# Patient Record
Sex: Female | Born: 1960 | Race: White | Hispanic: No | Smoking: Current some day smoker
Health system: Southern US, Community
[De-identification: ages and names within clinical notes are randomized; demographics above are authoritative.]

---

## 1999-03-24 ENCOUNTER — Other Ambulatory Visit: Admission: RE | Admit: 1999-03-24 | Discharge: 1999-03-24 | Payer: Self-pay | Admitting: Obstetrics & Gynecology

## 2000-04-21 ENCOUNTER — Ambulatory Visit (HOSPITAL_BASED_OUTPATIENT_CLINIC_OR_DEPARTMENT_OTHER): Admission: RE | Admit: 2000-04-21 | Discharge: 2000-04-21 | Payer: Self-pay | Admitting: General Surgery

## 2001-06-27 ENCOUNTER — Other Ambulatory Visit: Admission: RE | Admit: 2001-06-27 | Discharge: 2001-06-27 | Payer: Self-pay | Admitting: Obstetrics & Gynecology

## 2002-07-05 ENCOUNTER — Other Ambulatory Visit: Admission: RE | Admit: 2002-07-05 | Discharge: 2002-07-05 | Payer: Self-pay | Admitting: Obstetrics & Gynecology

## 2003-07-30 ENCOUNTER — Other Ambulatory Visit: Admission: RE | Admit: 2003-07-30 | Discharge: 2003-07-30 | Payer: Self-pay | Admitting: Obstetrics & Gynecology

## 2004-08-02 ENCOUNTER — Other Ambulatory Visit: Admission: RE | Admit: 2004-08-02 | Discharge: 2004-08-02 | Payer: Self-pay | Admitting: Obstetrics & Gynecology

## 2005-08-03 ENCOUNTER — Other Ambulatory Visit: Admission: RE | Admit: 2005-08-03 | Discharge: 2005-08-03 | Payer: Self-pay | Admitting: Obstetrics & Gynecology

## 2014-01-23 ENCOUNTER — Encounter: Payer: Self-pay | Admitting: Family Medicine

## 2014-01-23 ENCOUNTER — Encounter (INDEPENDENT_AMBULATORY_CARE_PROVIDER_SITE_OTHER): Payer: Self-pay

## 2014-01-23 ENCOUNTER — Ambulatory Visit (INDEPENDENT_AMBULATORY_CARE_PROVIDER_SITE_OTHER): Payer: BC Managed Care – PPO | Admitting: Family Medicine

## 2014-01-23 VITALS — BP 132/81 | HR 95 | Temp 98.9°F | Ht 67.0 in | Wt 126.2 lb

## 2014-01-23 DIAGNOSIS — J329 Chronic sinusitis, unspecified: Secondary | ICD-10-CM

## 2014-01-23 MED ORDER — METHYLPREDNISOLONE ACETATE 80 MG/ML IJ SUSP
80.0000 mg | Freq: Once | INTRAMUSCULAR | Status: AC
  Administered 2014-01-23: 80 mg via INTRAMUSCULAR

## 2014-01-23 MED ORDER — AMOXICILLIN 875 MG PO TABS: 875.0000 mg | ORAL_TABLET | Freq: Two times a day (BID) | ORAL | Status: AC

## 2014-01-23 NOTE — Progress Notes (Signed)
   Subjective:    Patient ID: Kaylee Wilkerson, female    DOB: [birth year], 53 y.o.   MRN: [id number]  HPI This 53 y.o. female presents for evaluation of sinus pressure and discomfort for the last few days. She has hx of sinus infections.   Review of Systems No chest pain, SOB, HA, dizziness, vision change, N/V, diarrhea, constipation, dysuria, urinary urgency or frequency, myalgias, arthralgias or rash.     Objective:   Physical Exam Vital signs noted  Well developed well nourished female.  HEENT - Head atraumatic Normocephalic                Eyes - PERRLA, Conjuctiva - clear Sclera- Clear EOMI                Ears - EAC's Wnl TM's Wnl Gross Hearing WNL                Throat - oropharanx wnl Respiratory - Lungs CTA bilateral Cardiac - RRR S1 and S2 without murmur GI - Abdomen soft Nontender and bowel sounds active x 4 Extremities - No edema. Neuro - Grossly intact.       Assessment & Plan:  Sinusitis - Plan: methylPREDNISolone acetate (DEPO-MEDROL) injection 80 mg, amoxicillin (AMOXIL) 875 MG tablet Push po fluids, rest, tylenol and motrin otc prn as directed for fever, arthralgias, and myalgias.  Follow up prn if sx's continue or persist.  Deatra CanterWilliam J Oxford FNP

## 2014-01-23 NOTE — Addendum Note (Signed)
Addended by: Deatra CanterXFORD, Jacynda Brunke J on: 01/23/2014 03:35 PM   Modules accepted: Level of Service

## 2015-03-09 ENCOUNTER — Other Ambulatory Visit: Payer: Self-pay | Admitting: Obstetrics & Gynecology

## 2015-03-09 DIAGNOSIS — R928 Other abnormal and inconclusive findings on diagnostic imaging of breast: Secondary | ICD-10-CM

## 2015-03-17 ENCOUNTER — Ambulatory Visit
Admission: RE | Admit: 2015-03-17 | Discharge: 2015-03-17 | Disposition: A | Discharge status: Home / Self Care | Payer: BLUE CROSS/BLUE SHIELD | Source: Ambulatory Visit | Attending: Obstetrics & Gynecology | Admitting: Obstetrics & Gynecology

## 2015-03-17 DIAGNOSIS — R928 Other abnormal and inconclusive findings on diagnostic imaging of breast: Secondary | ICD-10-CM

## 2016-04-25 IMAGING — MG MM DIAG BREAST TOMO UNI LEFT
4 series · 4 of 12 positions shown · non-contrast
Comparison: Previous exams.

CLINICAL DATA: Screening recall for left breast asymmetries.

EXAM:
DIGITAL DIAGNOSTIC LEFT MAMMOGRAM WITH 3D TOMOSYNTHESIS AND CAD
LEFT BREAST ULTRASOUND

[L CC]
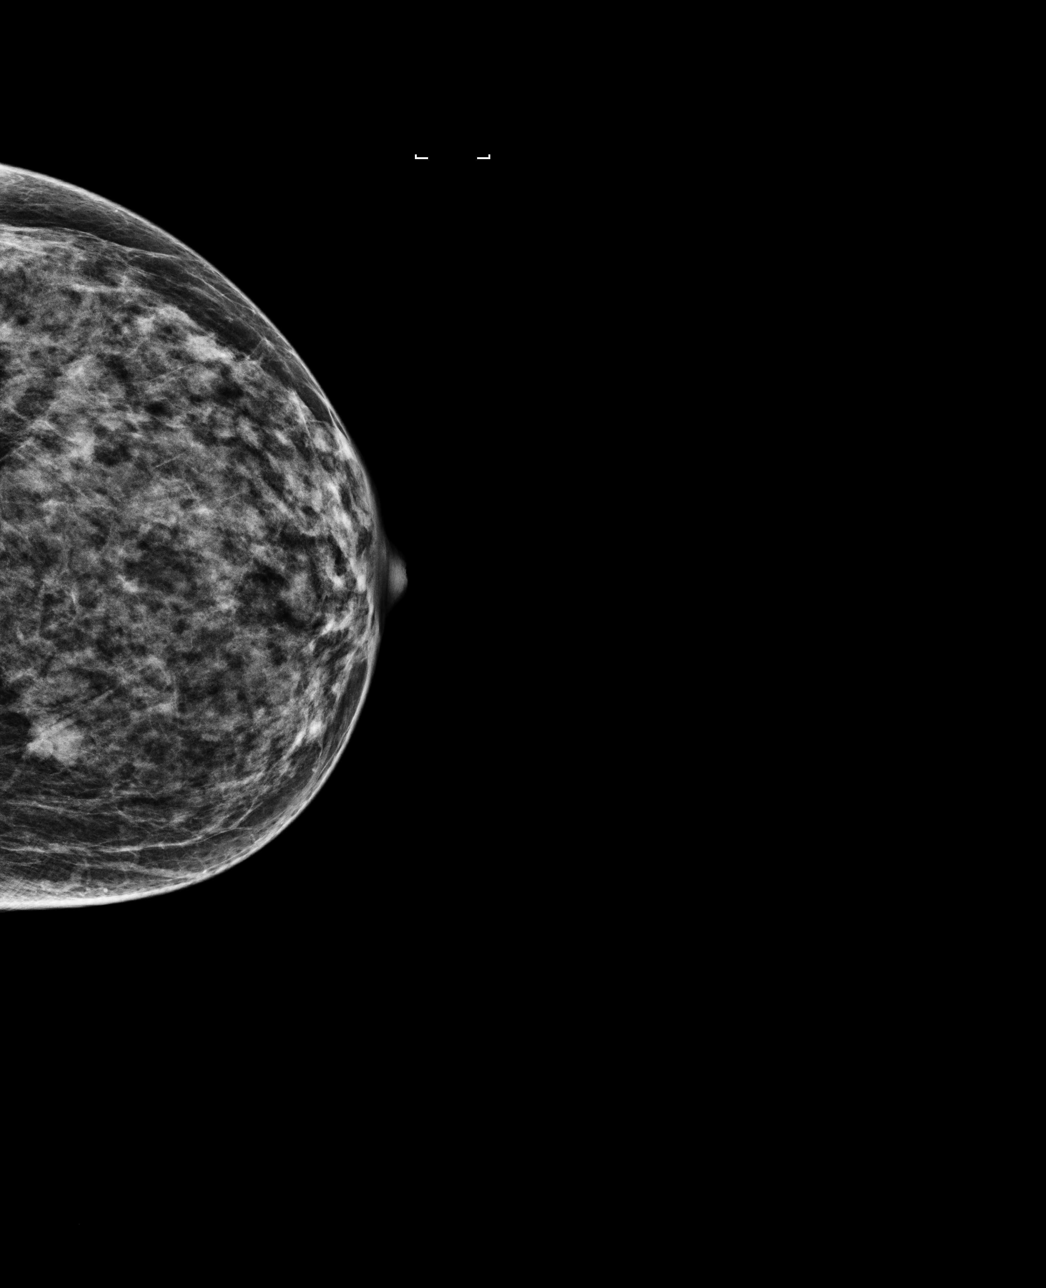

[L MLO]
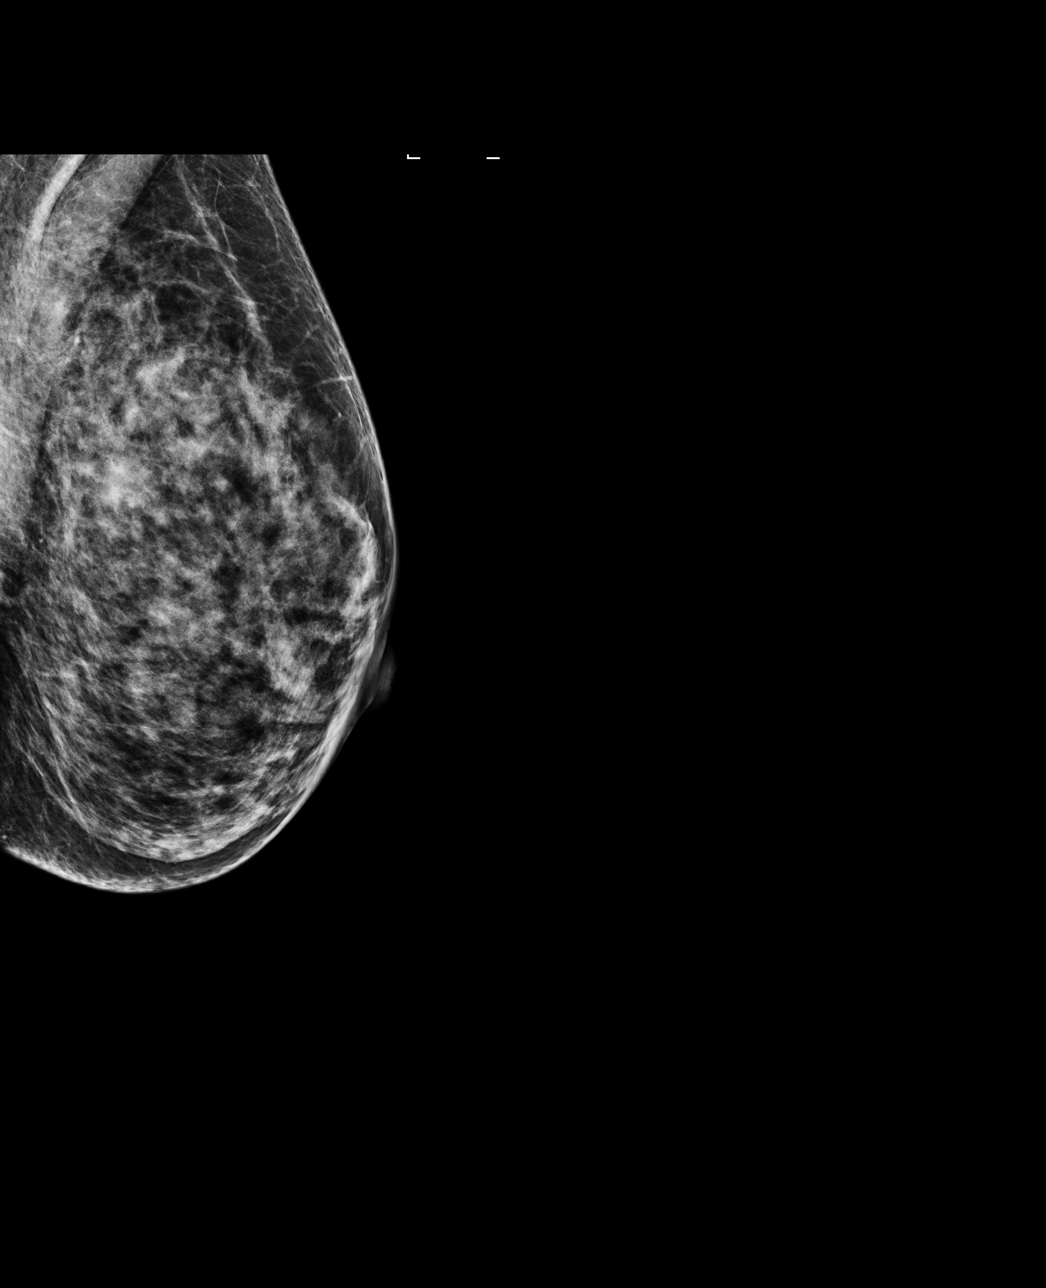

[L MLO tomo · tomo slice 27/53.0]
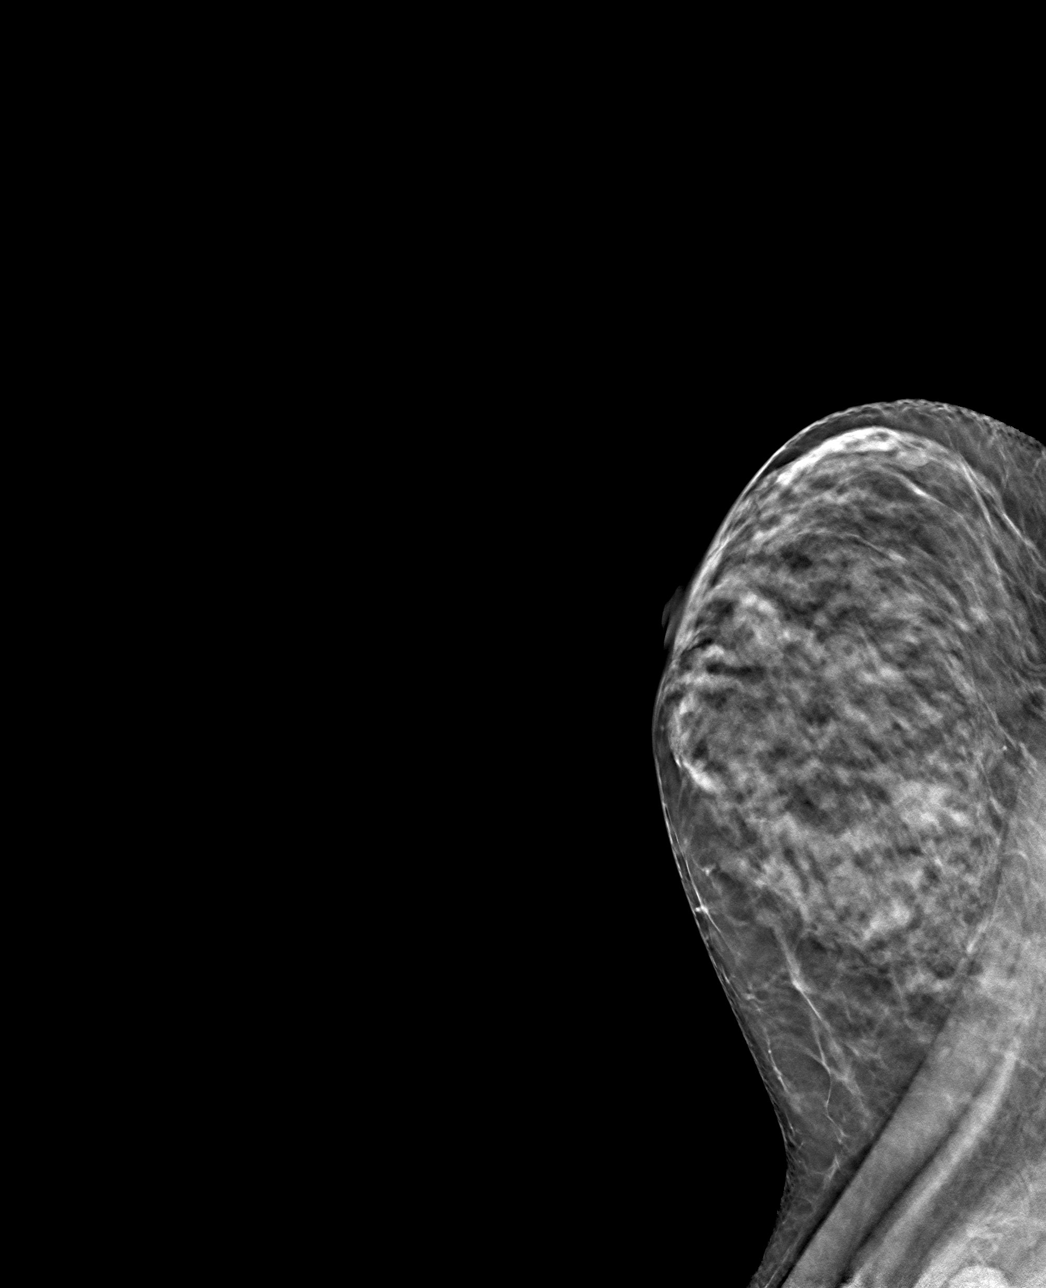

[L CC tomo · tomo slice 23/45.0]
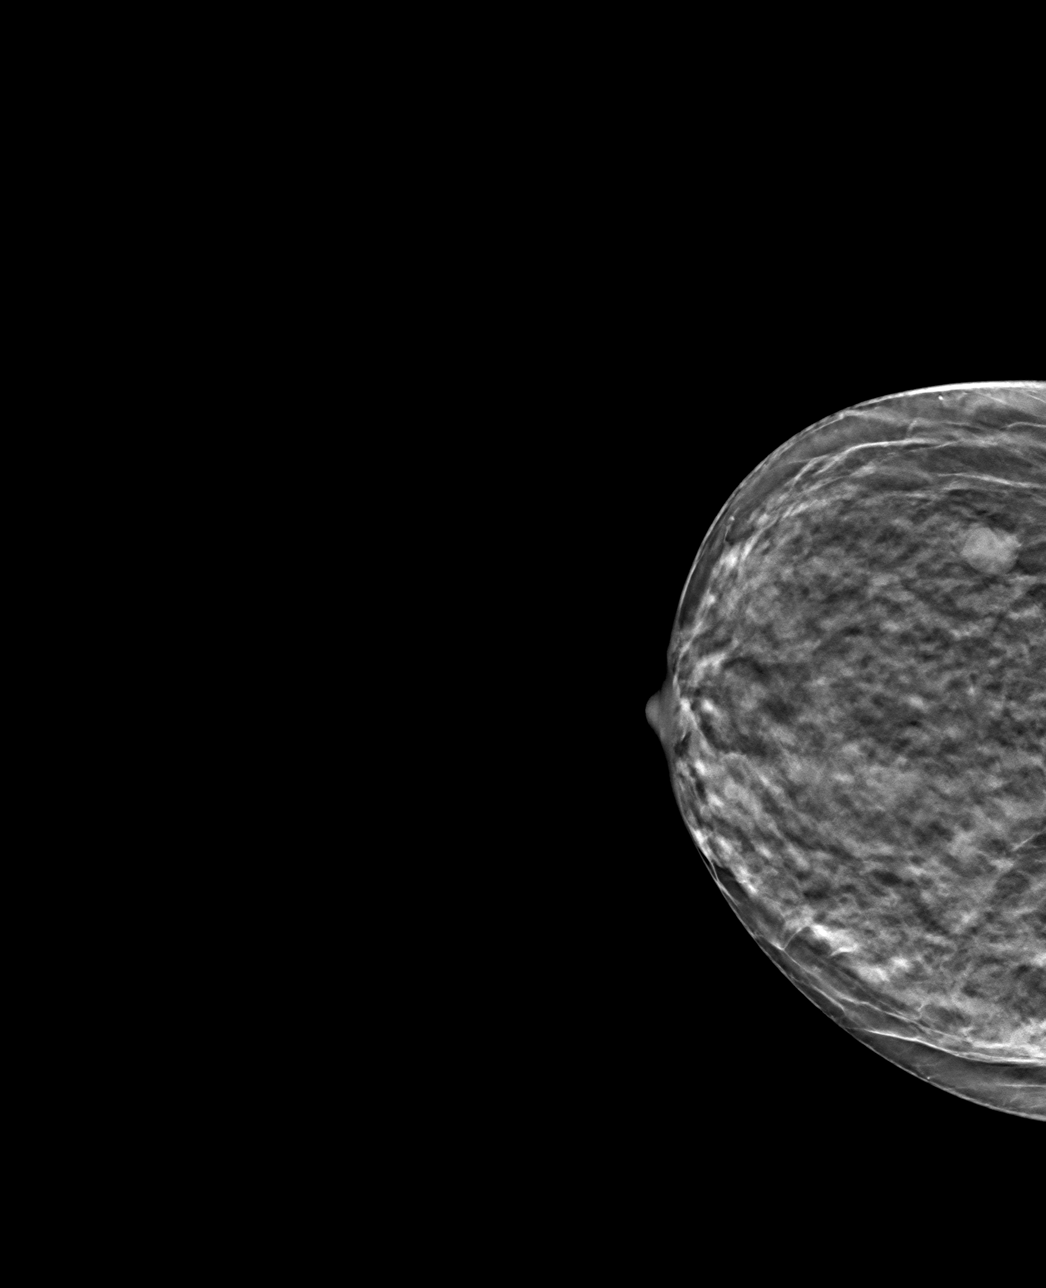

[4 of 12 positions shown; findings below may reference images not displayed]

ACR Breast Density Category c: The breast tissue is heterogeneously
dense, which may obscure small masses.
FINDINGS: The initially questioned asymmetry seen in the outer left breast on
the CC view only resolves on the additional CC tomosynthesis
compatible with overlapping tissue. There is a an oval circumscribed
mass in the upper inner left breast seen on both the CC and MLO
tomosynthesis measuring approximately 1.1 cm.

Mammographic images were processed with CAD.

Physical examination of the upper inner left breast is not reveal
any palpable masses.

Targeted ultrasound of the left breast was performed demonstrating
an oval lobulated well-circumscribed anechoic mass with posterior
acoustic enhancement compatible with a septated cyst versus 2
adjacent cysts at 11 o'clock 4 cm from the nipple measuring 1.5 x
0.7 x 1.2 cm. This corresponds with mammography findings.
IMPRESSION: Left breast cyst. There is no mammographic evidence of malignancy in
the left breast.

RECOMMENDATION:
Screening mammogram in one year.(Code:X7-D-CM2)

I have discussed the findings and recommendations with the patient.
Results were also provided in writing at the conclusion of the
visit. If applicable, a reminder letter will be sent to the patient
regarding the next appointment.

BI-RADS CATEGORY  2: Benign.

## 2023-09-07 ENCOUNTER — Other Ambulatory Visit: Payer: Self-pay | Admitting: Obstetrics and Gynecology

## 2023-09-07 DIAGNOSIS — R928 Other abnormal and inconclusive findings on diagnostic imaging of breast: Secondary | ICD-10-CM

## 2023-09-26 ENCOUNTER — Ambulatory Visit
Admission: RE | Admit: 2023-09-26 | Discharge: 2023-09-26 | Disposition: A | Discharge status: Home / Self Care | Payer: BC Managed Care – PPO | Source: Ambulatory Visit | Attending: Obstetrics and Gynecology | Admitting: Obstetrics and Gynecology

## 2023-09-26 ENCOUNTER — Ambulatory Visit: Payer: BLUE CROSS/BLUE SHIELD

## 2023-09-26 DIAGNOSIS — R928 Other abnormal and inconclusive findings on diagnostic imaging of breast: Secondary | ICD-10-CM

## 2024-09-11 ENCOUNTER — Other Ambulatory Visit: Payer: Self-pay | Admitting: Obstetrics and Gynecology

## 2024-09-11 DIAGNOSIS — R928 Other abnormal and inconclusive findings on diagnostic imaging of breast: Secondary | ICD-10-CM

## 2024-09-30 ENCOUNTER — Encounter

## 2024-09-30 ENCOUNTER — Other Ambulatory Visit

## 2024-10-08 ENCOUNTER — Inpatient Hospital Stay
Admission: RE | Admit: 2024-10-08 | Discharge: 2024-10-08 | Attending: Obstetrics and Gynecology | Admitting: Obstetrics and Gynecology

## 2024-10-08 ENCOUNTER — Inpatient Hospital Stay: Admission: RE | Admit: 2024-10-08

## 2024-10-08 DIAGNOSIS — R928 Other abnormal and inconclusive findings on diagnostic imaging of breast: Secondary | ICD-10-CM
